# Patient Record
Sex: Male | Born: 1950
Health system: Southern US, Community
[De-identification: ages and names within clinical notes are randomized; demographics above are authoritative.]

## PROBLEM LIST (undated history)

## (undated) DIAGNOSIS — I10 Essential (primary) hypertension: Secondary | ICD-10-CM

## (undated) DIAGNOSIS — I219 Acute myocardial infarction, unspecified: Secondary | ICD-10-CM

## (undated) DIAGNOSIS — E079 Disorder of thyroid, unspecified: Secondary | ICD-10-CM

## (undated) DIAGNOSIS — E7800 Pure hypercholesterolemia, unspecified: Secondary | ICD-10-CM

## (undated) DIAGNOSIS — K760 Fatty (change of) liver, not elsewhere classified: Secondary | ICD-10-CM

## (undated) DIAGNOSIS — N200 Calculus of kidney: Secondary | ICD-10-CM

## (undated) HISTORY — PX: CARDIAC DEFIBRILLATOR PLACEMENT: SHX171

---

## 2015-08-27 ENCOUNTER — Emergency Department (HOSPITAL_BASED_OUTPATIENT_CLINIC_OR_DEPARTMENT_OTHER)
Admission: EM | Admit: 2015-08-27 | Discharge: 2015-08-27 | Disposition: A | Discharge status: Home / Self Care | Payer: Medicare Other | Attending: Emergency Medicine | Admitting: Emergency Medicine

## 2015-08-27 ENCOUNTER — Emergency Department (HOSPITAL_BASED_OUTPATIENT_CLINIC_OR_DEPARTMENT_OTHER): Payer: Medicare Other

## 2015-08-27 ENCOUNTER — Encounter (HOSPITAL_BASED_OUTPATIENT_CLINIC_OR_DEPARTMENT_OTHER): Payer: Self-pay

## 2015-08-27 DIAGNOSIS — N201 Calculus of ureter: Secondary | ICD-10-CM | POA: Diagnosis not present

## 2015-08-27 DIAGNOSIS — Z87442 Personal history of urinary calculi: Secondary | ICD-10-CM | POA: Insufficient documentation

## 2015-08-27 DIAGNOSIS — Z7982 Long term (current) use of aspirin: Secondary | ICD-10-CM | POA: Insufficient documentation

## 2015-08-27 DIAGNOSIS — Z87891 Personal history of nicotine dependence: Secondary | ICD-10-CM | POA: Insufficient documentation

## 2015-08-27 DIAGNOSIS — Z791 Long term (current) use of non-steroidal anti-inflammatories (NSAID): Secondary | ICD-10-CM | POA: Diagnosis not present

## 2015-08-27 DIAGNOSIS — E079 Disorder of thyroid, unspecified: Secondary | ICD-10-CM | POA: Insufficient documentation

## 2015-08-27 DIAGNOSIS — I10 Essential (primary) hypertension: Secondary | ICD-10-CM | POA: Insufficient documentation

## 2015-08-27 DIAGNOSIS — Z79899 Other long term (current) drug therapy: Secondary | ICD-10-CM | POA: Diagnosis not present

## 2015-08-27 DIAGNOSIS — E7800 Pure hypercholesterolemia, unspecified: Secondary | ICD-10-CM | POA: Diagnosis not present

## 2015-08-27 DIAGNOSIS — R109 Unspecified abdominal pain: Secondary | ICD-10-CM | POA: Diagnosis present

## 2015-08-27 DIAGNOSIS — I252 Old myocardial infarction: Secondary | ICD-10-CM | POA: Insufficient documentation

## 2015-08-27 HISTORY — DX: Pure hypercholesterolemia, unspecified: E78.00

## 2015-08-27 HISTORY — DX: Acute myocardial infarction, unspecified: I21.9

## 2015-08-27 HISTORY — DX: Calculus of kidney: N20.0

## 2015-08-27 HISTORY — DX: Fatty (change of) liver, not elsewhere classified: K76.0

## 2015-08-27 HISTORY — DX: Essential (primary) hypertension: I10

## 2015-08-27 HISTORY — DX: Disorder of thyroid, unspecified: E07.9

## 2015-08-27 LAB — CBC WITH DIFFERENTIAL/PLATELET
BASOS PCT: 1 %
Basophils Absolute: 0 10*3/uL (ref 0.0–0.1)
EOS ABS: 0.2 10*3/uL (ref 0.0–0.7)
EOS PCT: 2 %
HCT: 45.7 % (ref 39.0–52.0)
HEMOGLOBIN: 15.2 g/dL (ref 13.0–17.0)
LYMPHS ABS: 2.1 10*3/uL (ref 0.7–4.0)
Lymphocytes Relative: 28 %
MCH: 33.6 pg (ref 26.0–34.0)
MCHC: 33.3 g/dL (ref 30.0–36.0)
MCV: 100.9 fL — ABNORMAL HIGH (ref 78.0–100.0)
MONO ABS: 0.7 10*3/uL (ref 0.1–1.0)
MONOS PCT: 9 %
NEUTROS PCT: 60 %
Neutro Abs: 4.7 10*3/uL (ref 1.7–7.7)
Platelets: 264 10*3/uL (ref 150–400)
RBC: 4.53 MIL/uL (ref 4.22–5.81)
RDW: 12.9 % (ref 11.5–15.5)
WBC: 7.8 10*3/uL (ref 4.0–10.5)

## 2015-08-27 LAB — COMPREHENSIVE METABOLIC PANEL
ALBUMIN: 4.6 g/dL (ref 3.5–5.0)
ALK PHOS: 63 U/L (ref 38–126)
ALT: 41 U/L (ref 17–63)
AST: 31 U/L (ref 15–41)
Anion gap: 7 (ref 5–15)
BUN: 21 mg/dL — AB (ref 6–20)
CALCIUM: 9.5 mg/dL (ref 8.9–10.3)
CHLORIDE: 103 mmol/L (ref 101–111)
CO2: 28 mmol/L (ref 22–32)
CREATININE: 0.92 mg/dL (ref 0.61–1.24)
GFR calc Af Amer: >60 mL/min (ref >60)
GFR calc non Af Amer: >60 mL/min (ref >60)
GLUCOSE: 104 mg/dL — AB (ref 65–99)
Potassium: 4.3 mmol/L (ref 3.5–5.1)
SODIUM: 138 mmol/L (ref 135–145)
Total Bilirubin: 0.7 mg/dL (ref 0.3–1.2)
Total Protein: 8 g/dL (ref 6.5–8.1)

## 2015-08-27 LAB — URINE MICROSCOPIC-ADD ON: SQUAMOUS EPITHELIAL / LPF: NONE SEEN

## 2015-08-27 LAB — URINALYSIS, ROUTINE W REFLEX MICROSCOPIC
BILIRUBIN URINE: NEGATIVE
Glucose, UA: NEGATIVE mg/dL
KETONES UR: NEGATIVE mg/dL
Leukocytes, UA: NEGATIVE
Nitrite: NEGATIVE
Protein, ur: NEGATIVE mg/dL
SPECIFIC GRAVITY, URINE: 1.014 (ref 1.005–1.030)
pH: 6 (ref 5.0–8.0)

## 2015-08-27 MED ORDER — IBUPROFEN 600 MG PO TABS: 600.0000 mg | ORAL_TABLET | Freq: Four times a day (QID) | ORAL | Status: AC | PRN

## 2015-08-27 MED ORDER — TAMSULOSIN HCL 0.4 MG PO CAPS: 0.4000 mg | ORAL_CAPSULE | Freq: Two times a day (BID) | ORAL | Status: AC

## 2015-08-27 MED ORDER — KETOROLAC TROMETHAMINE 30 MG/ML IJ SOLN
30.0000 mg | Freq: Once | INTRAMUSCULAR | Status: AC
Start: 2015-08-27 — End: 2015-08-27
  Administered 2015-08-27: 30 mg via INTRAVENOUS
  Filled 2015-08-27: qty 1

## 2015-08-27 MED ORDER — ONDANSETRON HCL 4 MG PO TABS
4.0000 mg | ORAL_TABLET | Freq: Four times a day (QID) | ORAL | Status: AC
Start: 2015-08-27 — End: ?

## 2015-08-27 MED ORDER — METOCLOPRAMIDE HCL 5 MG/ML IJ SOLN
10.0000 mg | Freq: Once | INTRAMUSCULAR | Status: AC
Start: 2015-08-27 — End: 2015-08-27
  Administered 2015-08-27: 10 mg via INTRAVENOUS
  Filled 2015-08-27: qty 2

## 2015-08-27 MED ORDER — OXYCODONE-ACETAMINOPHEN 5-325 MG PO TABS: 2.0000 | ORAL_TABLET | ORAL | Status: AC | PRN

## 2015-08-27 MED FILL — OXYCODONE/APAP 5-325: 5-325 | 2 days supply | Qty: 6 | Fill #0

## 2015-08-27 MED FILL — TAMSULOSIN HCL 0.4 MG CAP: 0.4 | 5 days supply | Qty: 10 | Fill #0

## 2015-08-27 MED FILL — IBUPROFEN 600 MG TABLET: 600 | 8 days supply | Qty: 30 | Fill #0

## 2015-08-27 MED FILL — ONDANSETRON HCL 4 MG TABLET: 4 | 3 days supply | Qty: 12 | Fill #0

## 2015-08-27 NOTE — ED Notes (Signed)
Patient denies nausea, vomiting or fever.

## 2015-08-27 NOTE — ED Notes (Signed)
C/o right side abd and flank pain-NAD

## 2015-08-27 NOTE — ED Provider Notes (Signed)
CSN: [id number]     Arrival date & time 08/27/15  1145 History   First MD Initiated Contact with Patient 08/27/15 1210     Chief Complaint  Patient presents with  . Abdominal Pain     (Consider location/radiation/quality/duration/timing/severity/associated sxs/prior Treatment) HPI Joel Gibbs is a 65 y.o. male history of hypertension, MI, fatty liver disease and previous kidney stone, comes in for evaluation of right flank pain. Patient reports sudden onset of right-sided flank pain this morning at approximately 5:00 AM. He reports a dull ache that is waxing and waning in timing. Pain is severe at its worst. Radiates from right flank to right groin. He denies any fevers, chills, nausea or vomiting, urinary symptoms, diarrhea or constipation, rectal pain. Patient reports last bowel movement was this morning and was normal for him. He does report a slightly decreased appetite. Has not taken anything to improve his symptoms. He does report difficulty finding a comfortable position. No chest pain, shortness of breath, other abdominal pain, numbness or weakness. No other modifying factors.  Past Medical History  Diagnosis Date  . MI (myocardial infarction) (HCC)   . Hypertension   . Thyroid disease   . High cholesterol   . Fatty liver disease, nonalcoholic   . Kidney stone    Past Surgical History  Procedure Laterality Date  . Cardiac defibrillator placement     No family history on file. Social History  Substance Use Topics  . Smoking status: Former Games developer  . Smokeless tobacco: None  . Alcohol Use: No    Review of Systems A 10 point review of systems was completed and was negative except for pertinent positives and negatives as mentioned in the history of present illness     Allergies  Definity  Home Medications   Prior to Admission medications   Medication Sig Start Date End Date Taking? Authorizing Provider  Ascorbic Acid (VITAMIN C) 1000 MG tablet Take 1,000 mg by mouth  daily.   Yes Historical Provider, MD  aspirin 81 MG tablet Take 81 mg by mouth daily.   Yes Historical Provider, MD  carvedilol (COREG) 25 MG tablet Take 25 mg by mouth 2 (two) times daily with a meal.   Yes Historical Provider, MD  CINNAMON PO Take by mouth.   Yes Historical Provider, MD  esomeprazole (NEXIUM) 20 MG capsule Take 20 mg by mouth daily at 12 noon.   Yes Historical Provider, MD  ibuprofen (ADVIL,MOTRIN) 600 MG tablet Take 1 tablet (600 mg total) by mouth every 6 (six) hours as needed. 08/27/15   Joycie Peek, PA-C  levothyroxine (SYNTHROID, LEVOTHROID) 50 MCG tablet Take 50 mcg by mouth daily before breakfast.   Yes Historical Provider, MD  naproxen sodium (ANAPROX) 220 MG tablet Take 220 mg by mouth 2 (two) times daily with a meal.   Yes Historical Provider, MD  nitroGLYCERIN (NITROSTAT) 0.4 MG SL tablet Place 0.4 mg under the tongue every 5 (five) minutes as needed for chest pain.   Yes Historical Provider, MD  omega-3 acid ethyl esters (LOVAZA) 1 g capsule Take by mouth 2 (two) times daily.   Yes Historical Provider, MD  ondansetron (ZOFRAN) 4 MG tablet Take 1 tablet (4 mg total) by mouth every 6 (six) hours. 08/27/15   Joycie Peek, PA-C  oxyCODONE-acetaminophen (PERCOCET/ROXICET) 5-325 MG tablet Take 2 tablets by mouth every 4 (four) hours as needed for severe pain. 08/27/15   Joycie Peek, PA-C  sacubitril-valsartan (ENTRESTO) 24-26 MG Take 1 tablet by mouth 2 (  two) times daily.   Yes Historical Provider, MD  simvastatin (ZOCOR) 40 MG tablet Take 40 mg by mouth daily.   Yes Historical Provider, MD  tamsulosin (FLOMAX) 0.4 MG CAPS capsule Take 1 capsule (0.4 mg total) by mouth 2 (two) times daily. 08/27/15   Joycie PeekBenjamin Christiaan Strebeck, PA-C  zolpidem (AMBIEN) 5 MG tablet Take 5 mg by mouth at bedtime as needed for sleep.   Yes Historical Provider, MD   BP 148/88 mmHg  Pulse 80  Temp(Src) 97.6 F (36.4 C) (Oral)  Resp 16  Ht 5\' 9"  (1.753 m)  Wt 74.844 kg  BMI 24.36 kg/m2   SpO2 99% Physical Exam  Constitutional: He is oriented to person, place, and time. He appears well-developed and well-nourished.  Overall well-appearing Caucasian male  HENT:  Head: Normocephalic and atraumatic.  Mouth/Throat: Oropharynx is clear and moist.  Eyes: Conjunctivae are normal. Pupils are equal, round, and reactive to light. Right eye exhibits no discharge. Left eye exhibits no discharge. No scleral icterus.  Neck: Neck supple.  Cardiovascular: Normal rate, regular rhythm and normal heart sounds.   Pulmonary/Chest: Effort normal and breath sounds normal. No respiratory distress. He has no wheezes. He has no rales.  Abdominal: Soft. There is no tenderness.  Genitourinary:  Normal male GU exam. No hernia.  Musculoskeletal: He exhibits no tenderness.  Neurological: He is alert and oriented to person, place, and time.  Cranial Nerves II-XII grossly intact  Skin: Skin is warm and dry. No rash noted.  Psychiatric: He has a normal mood and affect.  Nursing note and vitals reviewed.   ED Course  Procedures (including critical care time) Labs Review Labs Reviewed  COMPREHENSIVE METABOLIC PANEL - Abnormal; Notable for the following:    Glucose, Bld 104 (*)    BUN 21 (*)    All other components within normal limits  CBC WITH DIFFERENTIAL/PLATELET - Abnormal; Notable for the following:    MCV 100.9 (*)    All other components within normal limits  URINALYSIS, ROUTINE W REFLEX MICROSCOPIC (NOT AT [facility]) - Abnormal; Notable for the following:    Hgb urine dipstick LARGE (*)    All other components within normal limits  URINE MICROSCOPIC-ADD ON - Abnormal; Notable for the following:    Bacteria, UA RARE (*)    All other components within normal limits    Imaging Review Ct Renal Stone Study  08/27/2015  CLINICAL DATA:  Right flank pain since 5 a.m. with nausea EXAM: CT ABDOMEN AND PELVIS WITHOUT CONTRAST TECHNIQUE: Multidetector CT imaging of the abdomen and pelvis was performed  following the standard protocol without IV contrast. COMPARISON:  None. FINDINGS: Lower chest and abdominal wall: Single chamber ICD/ pacer into the right ventricle. Changes of right inguinal hernia repair. Hepatobiliary: No focal liver abnormality.No evidence of biliary obstruction or stone. Pancreas: Unremarkable. Spleen: Unremarkable. Adrenals/Urinary Tract:  Negative adrenals. 4 mm stone in the upper right ureter, just beyond the UPJ, with mild hydronephrosis. Symmetric nonspecific perinephric edema. ~ 2 mm stone in the upper pole left kidney. No left hydronephrosis or ureteral calculus. Reproductive:No pathologic findings. Stomach/Bowel: No obstruction. Few colonic diverticula. No appendicitis. Vascular/Lymphatic: Atherosclerosis with right iliac stenting. No mass or adenopathy. Peritoneal: No ascites or pneumoperitoneum. Peritoneal calcification, nonspecific. Musculoskeletal: No acute abnormalities. IMPRESSION: 1. 4 mm proximal right ureteral stone with mild hydronephrosis. 2. Nonobstructive left nephrolithiasis. Electronically Signed   By: Marnee SpringJonathon  Watts M.D.   On: 08/27/2015 13:29   I have personally reviewed and evaluated these images  and lab results as part of my medical decision-making.   EKG Interpretation None     Meds given in ED:  Medications  ketorolac (TORADOL) 30 MG/ML injection 30 mg (30 mg Intravenous Given 08/27/15 1236)  metoCLOPramide (REGLAN) injection 10 mg (10 mg Intravenous Given 08/27/15 1236)    Discharge Medication List as of 08/27/2015  1:47 PM    START taking these medications   Details  ibuprofen (ADVIL,MOTRIN) 600 MG tablet Take 1 tablet (600 mg total) by mouth every 6 (six) hours as needed., Starting 08/27/2015, Until Discontinued, Print    ondansetron (ZOFRAN) 4 MG tablet Take 1 tablet (4 mg total) by mouth every 6 (six) hours., Starting 08/27/2015, Until Discontinued, Print    oxyCODONE-acetaminophen (PERCOCET/ROXICET) 5-325 MG tablet Take 2 tablets by mouth  every 4 (four) hours as needed for severe pain., Starting 08/27/2015, Until Discontinued, Print    tamsulosin (FLOMAX) 0.4 MG CAPS capsule Take 1 capsule (0.4 mg total) by mouth 2 (two) times daily., Starting 08/27/2015, Until Discontinued, Print       Filed Vitals:   08/27/15 1152 08/27/15 1405  BP: 161/92 148/88  Pulse: 84 80  Temp: 97.6 F (36.4 C)   TempSrc: Oral   Resp: 16 16  Height: 5\' 9"  (1.753 m)   Weight: 74.844 kg   SpO2: 100% 99%    MDM  Pt has been diagnosed with a Kidney Stone via CT. There is no evidence of significant hydronephrosis, serum creatine WNL, vitals sign stable and the pt does not have irratractable vomiting. Pt will be dc home with pain medications & has been advised to follow up with PCP. Overall, appears very well, nontoxic, hemodynamically stable and afebrile. Given referral to [facility] if symptoms do not improve. Patient verbalizes understanding and agrees with this plan as well as subsequent discharge, voices no other questions or concerns at this time.  The patient appears reasonably screened and/or stabilized for discharge and I doubt any other medical condition or other [facility] requiring further screening, evaluation, or treatment in the ED at this time prior to discharge.    Final diagnoses:  Ureterolithiasis       Joycie Peek, PA-C 08/27/15 2213  Arby Barrette, MD 08/29/15 [phone]

## 2015-08-27 NOTE — Discharge Instructions (Signed)
You were evaluated in the ED today for your right flank pain and found to have a kidney stone. Your stone is small enough, 4 mm, that it should pass on its own with these medications. If your symptoms do not improve in the next week, follow up with [facility] for further evaluation and management of your symptoms. Return to ED for any new or worsening symptoms as we discussed. Take your medications as prescribed.  Kidney Stones Kidney stones (urolithiasis) are deposits that form inside your kidneys. The intense pain is caused by the stone moving through the urinary tract. When the stone moves, the ureter goes into spasm around the stone. The stone is usually passed in the urine.  CAUSES   A disorder that makes certain neck glands produce too much parathyroid hormone (primary hyperparathyroidism).  A buildup of uric acid crystals, similar to gout in your joints.  Narrowing (stricture) of the ureter.  A kidney obstruction present at birth (congenital obstruction).  Previous surgery on the kidney or ureters.  Numerous kidney infections. SYMPTOMS   Feeling sick to your stomach (nauseous).  Throwing up (vomiting).  Blood in the urine (hematuria).  Pain that usually spreads (radiates) to the groin.  Frequency or urgency of urination. DIAGNOSIS   Taking a history and physical exam.  Blood or urine tests.  CT scan.  Occasionally, an examination of the inside of the urinary bladder (cystoscopy) is performed. TREATMENT   Observation.  Increasing your fluid intake.  Extracorporeal shock wave lithotripsy--This is a noninvasive procedure that uses shock waves to break up kidney stones.  Surgery may be needed if you have severe pain or persistent obstruction. There are various surgical procedures. Most of the procedures are performed with the use of small instruments. Only small incisions are needed to accommodate these instruments, so recovery time is minimized. The size,  location, and chemical composition are all important variables that will determine the proper choice of action for you. Talk to your health care provider to better understand your situation so that you will minimize the risk of injury to yourself and your kidney.  HOME CARE INSTRUCTIONS   Drink enough water and fluids to keep your urine clear or pale yellow. This will help you to pass the stone or stone fragments.  Strain all urine through the provided strainer. Keep all particulate matter and stones for your health care provider to see. The stone causing the pain may be as small as a grain of salt. It is very important to use the strainer each and every time you pass your urine. The collection of your stone will allow your health care provider to analyze it and verify that a stone has actually passed. The stone analysis will often identify what you can do to reduce the incidence of recurrences.  Only take over-the-counter or prescription medicines for pain, discomfort, or fever as directed by your health care provider.  Keep all follow-up visits as told by your health care provider. This is important.  Get follow-up X-rays if required. The absence of pain does not always mean that the stone has passed. It may have only stopped moving. If the urine remains completely obstructed, it can cause loss of kidney function or even complete destruction of the kidney. It is your responsibility to make sure X-rays and follow-ups are completed. Ultrasounds of the kidney can show blockages and the status of the kidney. Ultrasounds are not associated with any radiation and can be performed easily in a matter  of minutes.  Make changes to your daily diet as told by your health care provider. You may be told to:  Limit the amount of salt that you eat.  Eat 5 or more servings of fruits and vegetables each day.  Limit the amount of meat, poultry, fish, and eggs that you eat.  Collect a 24-hour urine sample as told  by your health care provider.You may need to collect another urine sample every 6-12 months. SEEK MEDICAL CARE IF:  You experience pain that is progressive and unresponsive to any pain medicine you have been prescribed. SEEK IMMEDIATE MEDICAL CARE IF:   Pain cannot be controlled with the prescribed medicine.  You have a fever or shaking chills.  The severity or intensity of pain increases over 18 hours and is not relieved by pain medicine.  You develop a new onset of abdominal pain.  You feel faint or pass out.  You are unable to urinate.   This information is not intended to replace advice given to you by your health care provider. Make sure you discuss any questions you have with your health care provider.   Document Released: 08/03/2005 Document Revised: 04/24/2015 Document Reviewed: 01/04/2013 Elsevier Interactive Patient Education [employer]

## 2015-08-27 NOTE — ED Notes (Signed)
Patient stable and ambulatory.  Patient verbalizes understanding of discharge medications, instructions and follow-up. 

## 2016-12-02 IMAGING — CT CT RENAL STONE PROTOCOL
2 of 4 series · 17 of 46 positions shown, 19 images · non-contrast
Comparison: None.

CLINICAL DATA: Right flank pain since 5 a.m. with nausea

EXAM:
CT ABDOMEN AND PELVIS WITHOUT CONTRAST
TECHNIQUE: Multidetector CT imaging of the abdomen and pelvis was performed
following the standard protocol without IV contrast.

[Series 2: axial st · axial · 0.65mm/px · z∈[-660,-295]mm · 14 of 81 slices shown, 16 images]
[im 4/81  soft-tissue]
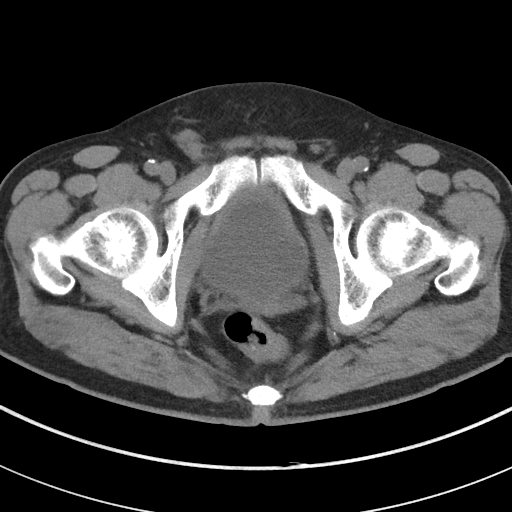
[im 4/81  bone]
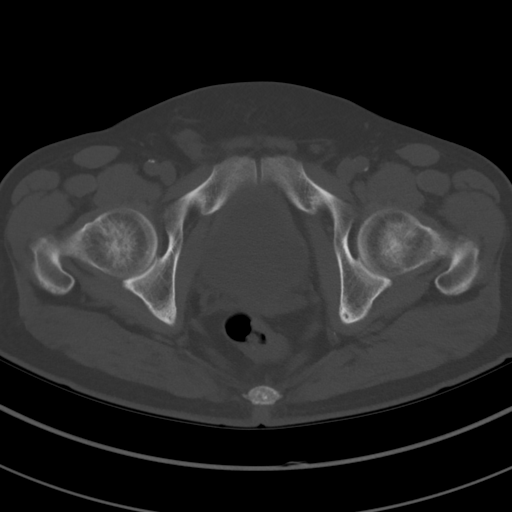
[im 11/81  soft-tissue]
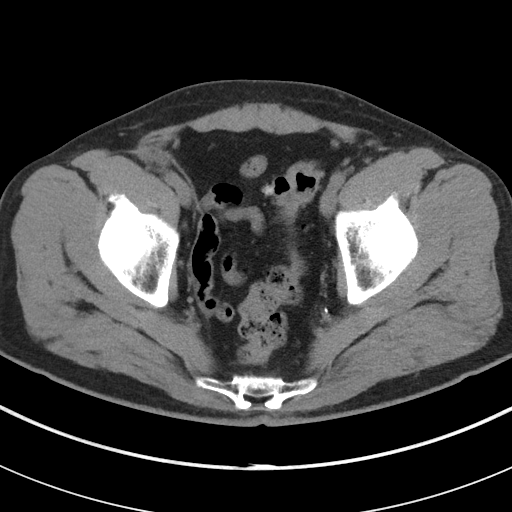
[im 17/81  soft-tissue]
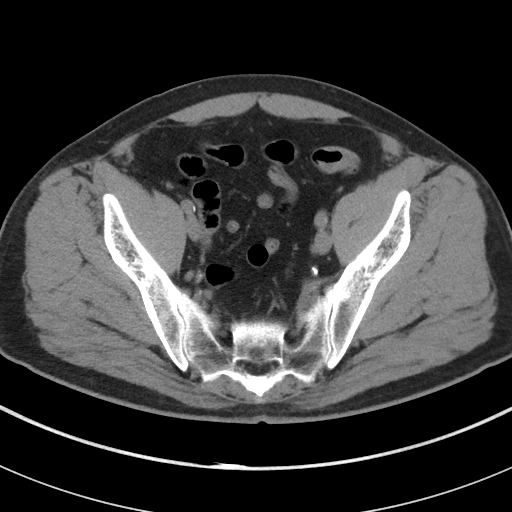
[im 21/81  soft-tissue]
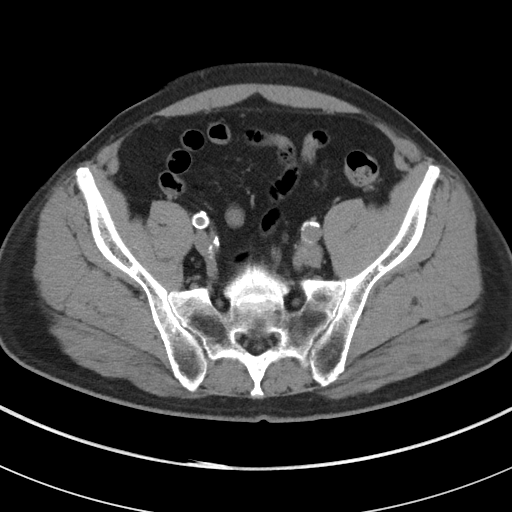
[im 27/81  soft-tissue]
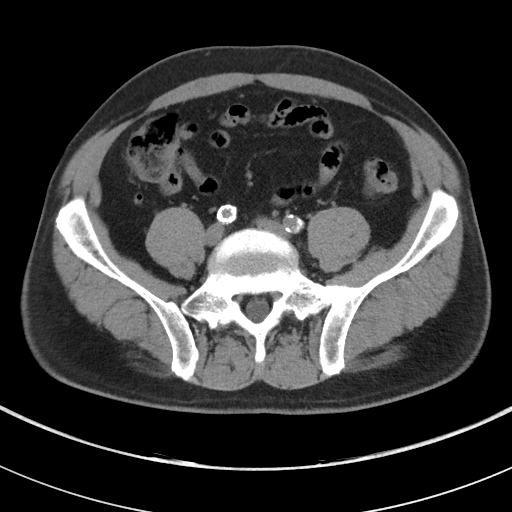
[im 34/81  soft-tissue]
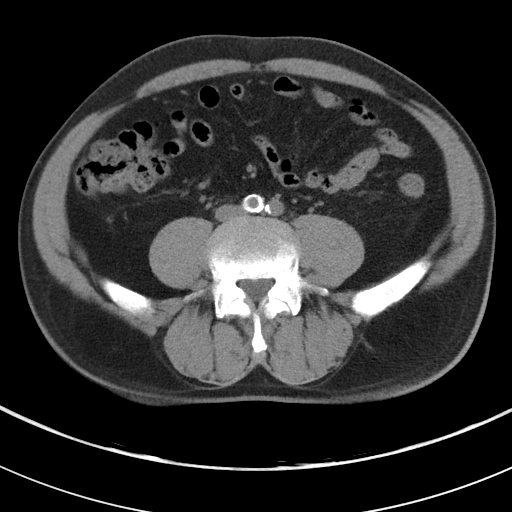
[im 37/81  soft-tissue]
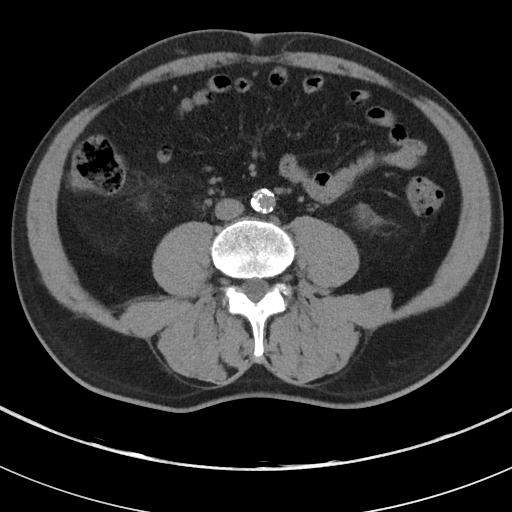
[im 44/81  soft-tissue]
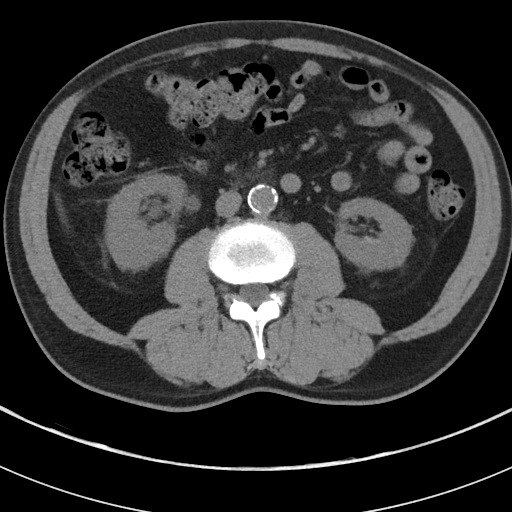
[im 47/81  soft-tissue]
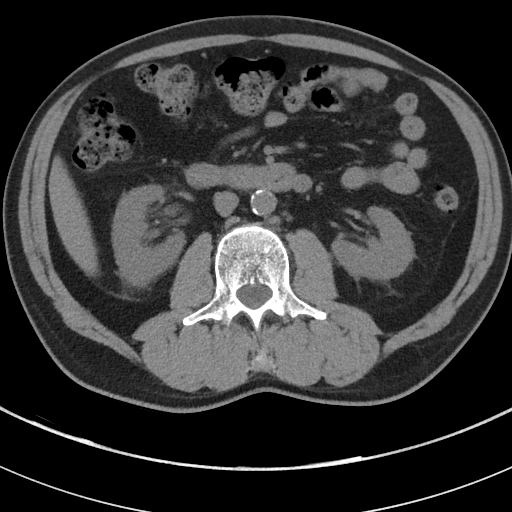
[im 47/81  bone]
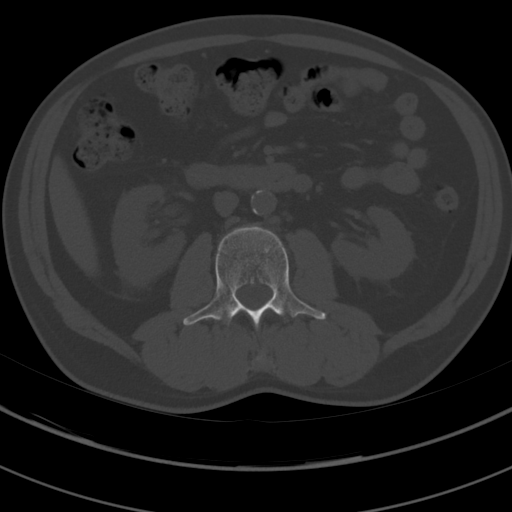
[im 54/81  soft-tissue]
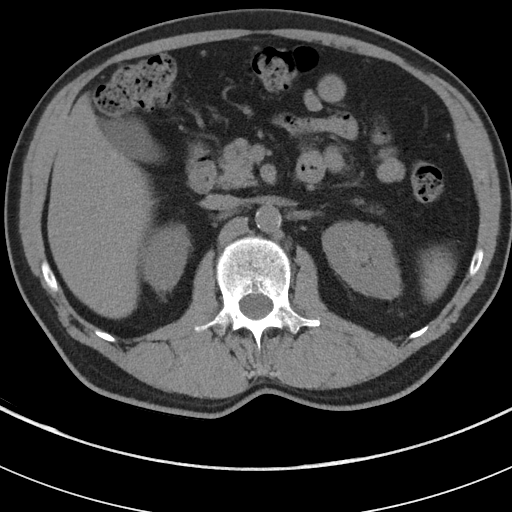
[im 61/81  soft-tissue]
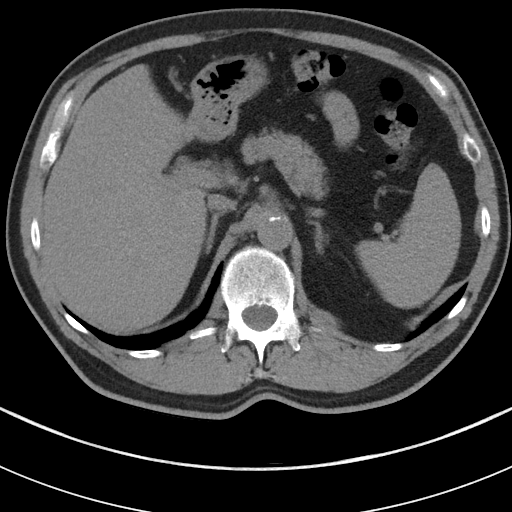
[im 64/81  soft-tissue]
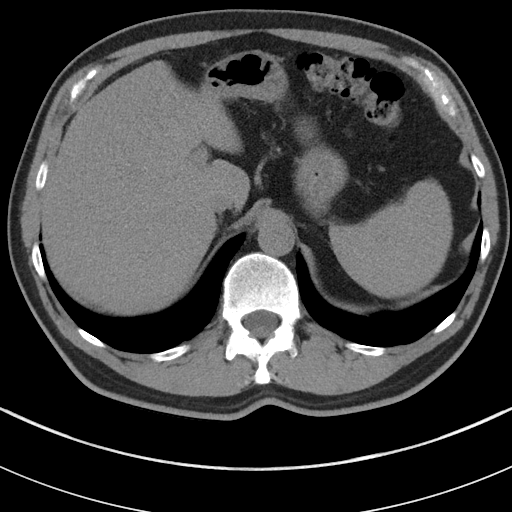
[im 71/81  soft-tissue]
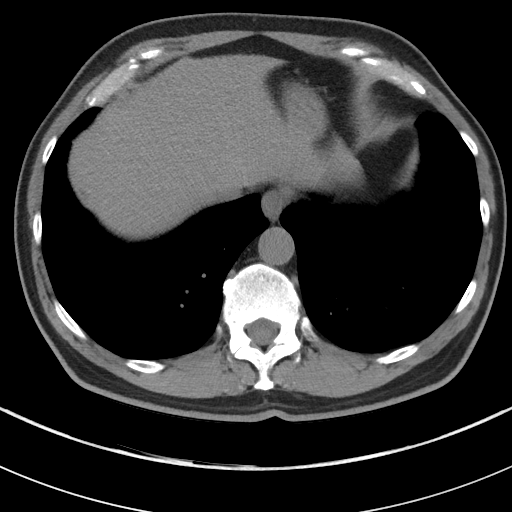
[im 77/81  soft-tissue]
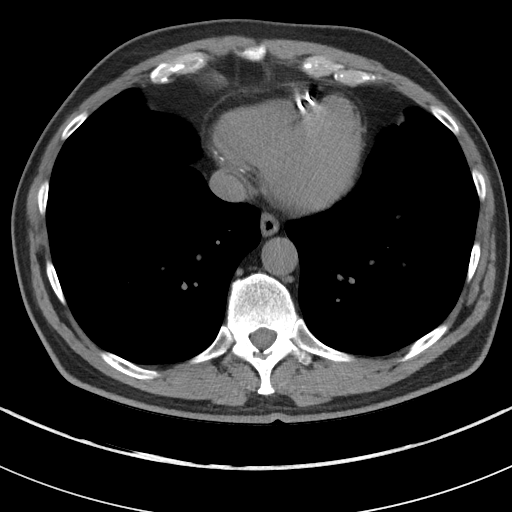

[Series 5: coronal st · coronal · 0.68mm/px · 3 of 94 slices shown]
[im 32/94  soft-tissue]
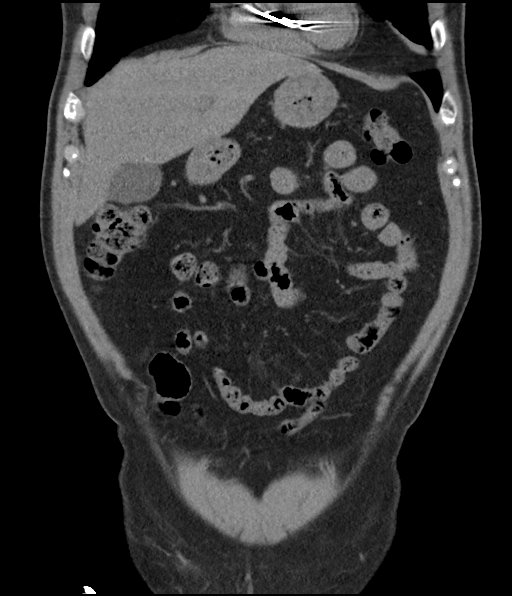
[im 42/94  soft-tissue]
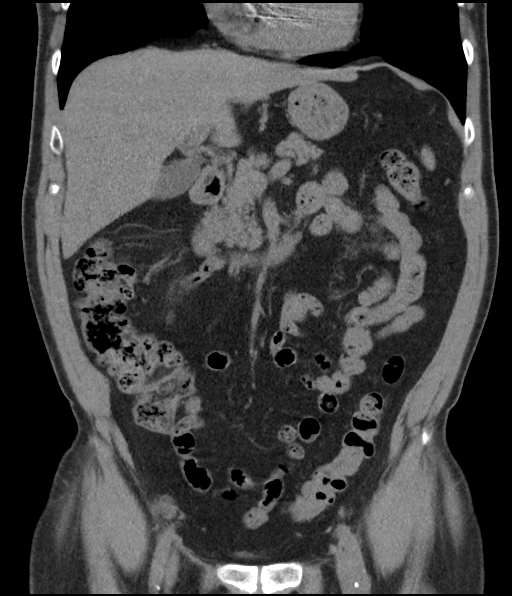
[im 52/94  soft-tissue]
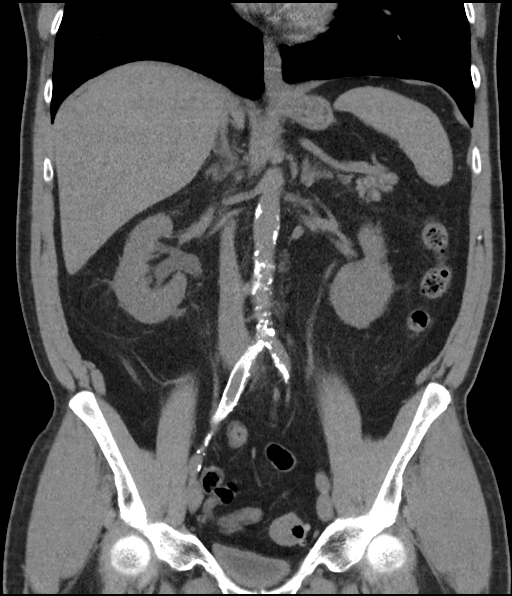

[17 of 46 positions shown; findings below may reference images not displayed]

FINDINGS: Lower chest and abdominal wall: Single chamber ICD/ pacer into the
right ventricle.

Changes of right inguinal hernia repair.

Hepatobiliary: No focal liver abnormality.No evidence of biliary
obstruction or stone.

Pancreas: Unremarkable.

Spleen: Unremarkable.

Adrenals/Urinary Tract:  Negative adrenals.

4 mm stone in the upper right ureter, just beyond the UPJ, with mild
hydronephrosis. Symmetric nonspecific perinephric edema. 
 2 mm
stone in the upper pole left kidney. No left hydronephrosis or
ureteral calculus.

Reproductive:No pathologic findings.

Stomach/Bowel: No obstruction. Few colonic diverticula. No
appendicitis.

Vascular/Lymphatic: Atherosclerosis with right iliac stenting. No
mass or adenopathy.

Peritoneal: No ascites or pneumoperitoneum. Peritoneal
calcification, nonspecific.

Musculoskeletal: No acute abnormalities.
IMPRESSION: 1. 4 mm proximal right ureteral stone with mild hydronephrosis.
2. Nonobstructive left nephrolithiasis.
